# Patient Record
Sex: Female | Born: 1935 | Race: White | Hispanic: No | Marital: Married | Smoking: Never smoker
Health system: Southern US, Community
[De-identification: ages and names within clinical notes are randomized; demographics above are authoritative.]

## PROBLEM LIST (undated history)

## (undated) DIAGNOSIS — Z9889 Other specified postprocedural states: Secondary | ICD-10-CM

## (undated) DIAGNOSIS — E119 Type 2 diabetes mellitus without complications: Secondary | ICD-10-CM

## (undated) DIAGNOSIS — K219 Gastro-esophageal reflux disease without esophagitis: Secondary | ICD-10-CM

## (undated) DIAGNOSIS — Z8719 Personal history of other diseases of the digestive system: Secondary | ICD-10-CM

## (undated) DIAGNOSIS — I10 Essential (primary) hypertension: Secondary | ICD-10-CM

## (undated) DIAGNOSIS — N390 Urinary tract infection, site not specified: Secondary | ICD-10-CM

## (undated) DIAGNOSIS — I447 Left bundle-branch block, unspecified: Secondary | ICD-10-CM

## (undated) DIAGNOSIS — R112 Nausea with vomiting, unspecified: Secondary | ICD-10-CM

## (undated) DIAGNOSIS — E039 Hypothyroidism, unspecified: Secondary | ICD-10-CM

## (undated) HISTORY — PX: ABDOMINAL HYSTERECTOMY: SHX81

## (undated) HISTORY — PX: CHOLECYSTECTOMY: SHX55

---

## 1998-02-07 HISTORY — PX: BACK SURGERY: SHX140

## 2001-02-07 HISTORY — PX: JOINT REPLACEMENT: SHX530

## 2001-06-14 ENCOUNTER — Encounter: Payer: Self-pay | Admitting: *Deleted

## 2001-06-14 ENCOUNTER — Ambulatory Visit (HOSPITAL_COMMUNITY): Admission: RE | Admit: 2001-06-14 | Discharge: 2001-06-14 | Payer: Self-pay | Admitting: *Deleted

## 2003-02-21 ENCOUNTER — Ambulatory Visit (HOSPITAL_COMMUNITY): Admission: RE | Admit: 2003-02-21 | Discharge: 2003-02-21 | Payer: Self-pay | Admitting: *Deleted

## 2003-03-20 ENCOUNTER — Encounter: Payer: Self-pay | Admitting: Neurosurgery

## 2003-03-20 ENCOUNTER — Ambulatory Visit (HOSPITAL_COMMUNITY): Admission: RE | Admit: 2003-03-20 | Discharge: 2003-03-21 | Payer: Self-pay | Admitting: Neurosurgery

## 2003-09-15 ENCOUNTER — Encounter: Admission: RE | Admit: 2003-09-15 | Discharge: 2003-09-15 | Payer: Self-pay | Admitting: Neurosurgery

## 2003-10-06 ENCOUNTER — Encounter: Admission: RE | Admit: 2003-10-06 | Discharge: 2003-10-06 | Payer: Self-pay | Admitting: Neurosurgery

## 2004-02-26 ENCOUNTER — Encounter: Admission: RE | Admit: 2004-02-26 | Discharge: 2004-02-26 | Payer: Self-pay | Admitting: Neurosurgery

## 2010-02-07 HISTORY — PX: EYE SURGERY: SHX253

## 2013-07-26 ENCOUNTER — Other Ambulatory Visit: Payer: Self-pay | Admitting: Neurosurgery

## 2013-07-26 DIAGNOSIS — M549 Dorsalgia, unspecified: Secondary | ICD-10-CM

## 2013-07-29 ENCOUNTER — Ambulatory Visit
Admission: RE | Admit: 2013-07-29 | Discharge: 2013-07-29 | Disposition: A | Discharge status: Home / Self Care | Payer: Medicare Other | Source: Ambulatory Visit | Attending: Neurosurgery | Admitting: Neurosurgery

## 2013-07-29 VITALS — BP 147/73 | HR 90

## 2013-07-29 DIAGNOSIS — M549 Dorsalgia, unspecified: Secondary | ICD-10-CM

## 2013-07-29 MED ORDER — IOHEXOL 180 MG/ML  SOLN
1.0000 mL | Freq: Once | INTRAMUSCULAR | Status: AC | PRN
  Administered 2013-07-29: 1 mL via EPIDURAL

## 2013-07-29 MED ORDER — METHYLPREDNISOLONE ACETATE 40 MG/ML INJ SUSP (RADIOLOG
120.0000 mg | Freq: Once | INTRAMUSCULAR | Status: AC
  Administered 2013-07-29: 120 mg via EPIDURAL

## 2013-07-29 NOTE — Discharge Instructions (Signed)

## 2013-08-14 ENCOUNTER — Other Ambulatory Visit: Payer: Self-pay | Admitting: Neurosurgery

## 2013-08-15 ENCOUNTER — Encounter (HOSPITAL_COMMUNITY): Payer: Self-pay | Admitting: Pharmacy Technician

## 2013-08-21 ENCOUNTER — Encounter (HOSPITAL_COMMUNITY): Payer: Self-pay

## 2013-08-21 ENCOUNTER — Encounter (HOSPITAL_COMMUNITY)
Admission: RE | Admit: 2013-08-21 | Discharge: 2013-08-21 | Disposition: A | Discharge status: Home / Self Care | Payer: Medicare Other | Source: Ambulatory Visit | Attending: Neurosurgery | Admitting: Neurosurgery

## 2013-08-21 ENCOUNTER — Encounter (HOSPITAL_COMMUNITY)
Admission: RE | Admit: 2013-08-21 | Discharge: 2013-08-21 | Disposition: A | Discharge status: Home / Self Care | Payer: Medicare Other | Source: Ambulatory Visit | Attending: Anesthesiology | Admitting: Anesthesiology

## 2013-08-21 HISTORY — DX: Nausea with vomiting, unspecified: R11.2

## 2013-08-21 HISTORY — DX: Personal history of other diseases of the digestive system: Z87.19

## 2013-08-21 HISTORY — DX: Urinary tract infection, site not specified: N39.0

## 2013-08-21 HISTORY — DX: Left bundle-branch block, unspecified: I44.7

## 2013-08-21 HISTORY — DX: Type 2 diabetes mellitus without complications: E11.9

## 2013-08-21 HISTORY — DX: Other specified postprocedural states: Z98.890

## 2013-08-21 HISTORY — DX: Gastro-esophageal reflux disease without esophagitis: K21.9

## 2013-08-21 HISTORY — DX: Hypothyroidism, unspecified: E03.9

## 2013-08-21 HISTORY — DX: Essential (primary) hypertension: I10

## 2013-08-21 LAB — BASIC METABOLIC PANEL
Anion gap: 14 (ref 5–15)
BUN: 16 mg/dL (ref 6–23)
CALCIUM: 9.6 mg/dL (ref 8.4–10.5)
CHLORIDE: 100 meq/L (ref 96–112)
CO2: 27 mEq/L (ref 19–32)
Creatinine, Ser: 0.6 mg/dL (ref 0.50–1.10)
GFR calc Af Amer: >90 mL/min (ref >90)
GFR calc non Af Amer: 86 mL/min — ABNORMAL LOW (ref >90)
GLUCOSE: 146 mg/dL — AB (ref 70–99)
Potassium: 4.2 mEq/L (ref 3.7–5.3)
Sodium: 141 mEq/L (ref 137–147)

## 2013-08-21 LAB — SURGICAL PCR SCREEN
MRSA, PCR: NEGATIVE
Staphylococcus aureus: NEGATIVE

## 2013-08-21 LAB — CBC
HEMATOCRIT: 38.9 % (ref 36.0–46.0)
HEMOGLOBIN: 13.2 g/dL (ref 12.0–15.0)
MCH: 31.1 pg (ref 26.0–34.0)
MCHC: 33.9 g/dL (ref 30.0–36.0)
MCV: 91.5 fL (ref 78.0–100.0)
Platelets: 191 10*3/uL (ref 150–400)
RBC: 4.25 MIL/uL (ref 3.87–5.11)
RDW: 13 % (ref 11.5–15.5)
WBC: 8.7 10*3/uL (ref 4.0–10.5)

## 2013-08-21 MED ORDER — DEXAMETHASONE SODIUM PHOSPHATE 4 MG/ML IJ SOLN
10.0000 mg | INTRAMUSCULAR | Status: DC
  Filled 2013-08-21: qty 3
  Filled 2013-08-21 (×2): qty 1

## 2013-08-21 MED ORDER — CEFAZOLIN SODIUM-DEXTROSE 2-3 GM-% IV SOLR
2.0000 g | INTRAVENOUS | Status: AC
  Administered 2013-08-22: 2 g via INTRAVENOUS
  Filled 2013-08-21 (×2): qty 50

## 2013-08-21 NOTE — Progress Notes (Signed)
Anesthesia Chart Review:  Patient is a 79 year old female scheduled for left L3-4 microdiskectomy tomorrow.  Her PAT appointment was today, and chart was given to me to review at 4 PM.  History includes non-smoker, HTN, DM2, GERD, hiatal hernia, left BBB, hypothyroidism, right joint replacement, cholecystectomy, L4-5 microdissection 03/20/03, post-operative N/V and sore throat after anesthesia. PCP is with [facility] in [place] (notes under Care Everywhere). No cardiac history or recent cardiac studies reported.   EKG on 08/21/13 showed SR, left BBB. She had a left BBB on previous preoperative EKG on 03/20/03 (see Muse; also printed and placed on chart).    CXR on 08/21/13 showed No acute cardiopulmonary abnormality seen.  Preoperative labs noted.  A1C on 06/05/13 was 7.0.    Left BBB has been present for at least 10 years, so clinical correlation on the day of surgery.  If no worrisome CV symptoms (none reported to her PAT RN today), then I would anticipate that she could proceed as planned. Further evaluation by her assigned anesthesiologist on the day of surgery.    Velna Ochsllison Rashae Rother, PA-C [facility]/Anesthesiology Phone [phone] 08/21/2013 4:10 PM

## 2013-08-21 NOTE — Progress Notes (Signed)
req'd notes, ekg from [facility] [place]

## 2013-08-21 NOTE — Pre-Procedure Instructions (Addendum)
Susan Ibarra  08/21/2013   Your procedure is scheduled on:  08/22/13  [facility] to [facility] short stay admitting at 800 AM.  Call this number if you have problems the morning of surgery: [phone]   Remember:   Do not eat food or drink liquids after midnight.   Take these medicines the morning of surgery with A SIP OF WATER: levothyroxine, metoprolol(toprol),zantac       STOP all herbel meds, nsaids (aleve,naproxen,advil,ibuprofen) now including,vitamins, aspirin   NO DIABETIC MED AM OF SURGERY    Do not wear jewelry, make-up or nail polish.  Do not wear lotions, powders, or perfumes. You may wear deodorant.  Do not shave 48 hours prior to surgery. Men may shave face and neck.  Do not bring valuables to the hospital.  [facility] is not responsible                  for any belongings or valuables.               Contacts, dentures or bridgework may not be worn into surgery.  Leave suitcase in the car. After surgery it may be brought to your room.  For patients admitted to the hospital, discharge time is determined by your                treatment team.               Patients discharged the day of surgery will not be allowed to drive  home.  Name and phone number of your driver:   Special Instructions:  Special Instructions: [facility] - Preparing for Surgery  Before surgery, you can play an important role.  Because skin is not sterile, your skin needs to be as free of germs as possible.  You can reduce the number of germs on you skin by washing with CHG (chlorahexidine gluconate) soap before surgery.  CHG is an antiseptic cleaner which kills germs and bonds with the skin to continue killing germs even after washing.  Please DO NOT use if you have an allergy to CHG or antibacterial soaps.  If your skin becomes reddened/irritated stop using the CHG and inform your nurse when you arrive at Short Stay.  Do not shave (including legs and underarms) for at least 48 hours prior to the  first CHG shower.  You may shave your face.  Please follow these instructions carefully:   1.  Shower with CHG Soap the night before surgery and the morning of Surgery.  2.  If you choose to wash your hair, wash your hair first as usual with your normal shampoo.  3.  After you shampoo, rinse your hair and body thoroughly to remove the Shampoo.  4.  Use CHG as you would any other liquid soap.  You can apply chg directly  to the skin and wash gently with scrungie or a clean washcloth.  5.  Apply the CHG Soap to your body ONLY FROM THE NECK DOWN.  Do not use on open wounds or open sores.  Avoid contact with your eyes ears, mouth and genitals (private parts).  Wash genitals (private parts)       with your normal soap.  6.  Wash thoroughly, paying special attention to the area where your surgery will be performed.  7.  Thoroughly rinse your body with warm water from the neck down.  8.  DO NOT shower/wash with your normal soap after using and rinsing off the  CHG Soap.  9.  Pat yourself dry with a clean towel.            10.  Wear clean pajamas.            11.  Place clean sheets on your bed the night of your first shower and do not sleep with pets.  Day of Surgery  Do not apply any lotions/deodorants the morning of surgery.  Please wear clean clothes to the hospital/surgery center.   Please read over the following fact sheets that you were given: Pain Booklet, Coughing and Deep Breathing, MRSA Information and Surgical Site Infection Prevention

## 2013-08-22 ENCOUNTER — Encounter (HOSPITAL_COMMUNITY): Payer: Self-pay | Admitting: Surgery

## 2013-08-22 ENCOUNTER — Ambulatory Visit (HOSPITAL_COMMUNITY)
Admission: RE | Admit: 2013-08-22 | Discharge: 2013-08-23 | Disposition: A | Discharge status: Home / Self Care | Payer: Medicare Other | Source: Ambulatory Visit | Attending: Neurosurgery | Admitting: Neurosurgery

## 2013-08-22 ENCOUNTER — Ambulatory Visit (HOSPITAL_COMMUNITY): Payer: Medicare Other | Admitting: Anesthesiology

## 2013-08-22 ENCOUNTER — Encounter (HOSPITAL_COMMUNITY): Payer: Medicare Other | Admitting: Vascular Surgery

## 2013-08-22 ENCOUNTER — Ambulatory Visit (HOSPITAL_COMMUNITY): Payer: Medicare Other

## 2013-08-22 ENCOUNTER — Encounter (HOSPITAL_COMMUNITY)
Admission: RE | Disposition: A | Discharge status: Home / Self Care | Payer: Medicare Other | Source: Ambulatory Visit | Attending: Neurosurgery

## 2013-08-22 DIAGNOSIS — I447 Left bundle-branch block, unspecified: Secondary | ICD-10-CM | POA: Insufficient documentation

## 2013-08-22 DIAGNOSIS — K219 Gastro-esophageal reflux disease without esophagitis: Secondary | ICD-10-CM | POA: Insufficient documentation

## 2013-08-22 DIAGNOSIS — Z7982 Long term (current) use of aspirin: Secondary | ICD-10-CM | POA: Insufficient documentation

## 2013-08-22 DIAGNOSIS — M5126 Other intervertebral disc displacement, lumbar region: Secondary | ICD-10-CM | POA: Insufficient documentation

## 2013-08-22 DIAGNOSIS — K449 Diaphragmatic hernia without obstruction or gangrene: Secondary | ICD-10-CM | POA: Insufficient documentation

## 2013-08-22 DIAGNOSIS — Z9660 Presence of unspecified orthopedic joint implant: Secondary | ICD-10-CM | POA: Insufficient documentation

## 2013-08-22 DIAGNOSIS — E119 Type 2 diabetes mellitus without complications: Secondary | ICD-10-CM | POA: Insufficient documentation

## 2013-08-22 DIAGNOSIS — I10 Essential (primary) hypertension: Secondary | ICD-10-CM | POA: Insufficient documentation

## 2013-08-22 DIAGNOSIS — E039 Hypothyroidism, unspecified: Secondary | ICD-10-CM | POA: Insufficient documentation

## 2013-08-22 HISTORY — PX: LUMBAR LAMINECTOMY/DECOMPRESSION MICRODISCECTOMY: SHX5026

## 2013-08-22 LAB — GLUCOSE, CAPILLARY
Glucose-Capillary: 147 mg/dL — ABNORMAL HIGH (ref 70–99)
Glucose-Capillary: 98 mg/dL (ref 70–99)
Glucose-Capillary: 221 mg/dL — ABNORMAL HIGH (ref 70–99)
Glucose-Capillary: 287 mg/dL — ABNORMAL HIGH (ref 70–99)

## 2013-08-22 SURGERY — LUMBAR LAMINECTOMY/DECOMPRESSION MICRODISCECTOMY 1 LEVEL
Anesthesia: General | Site: Back | Laterality: Left

## 2013-08-22 MED ORDER — HYDROMORPHONE HCL PF 1 MG/ML IJ SOLN
INTRAMUSCULAR | Status: AC
  Filled 2013-08-22: qty 1

## 2013-08-22 MED ORDER — ROCURONIUM BROMIDE 100 MG/10ML IV SOLN
INTRAVENOUS | Status: DC | PRN
  Administered 2013-08-22: 50 mg via INTRAVENOUS

## 2013-08-22 MED ORDER — INSULIN ASPART 100 UNIT/ML ~~LOC~~ SOLN
0.0000 [IU] | Freq: Three times a day (TID) | SUBCUTANEOUS | Status: DC
  Administered 2013-08-22: 3 [IU] via SUBCUTANEOUS

## 2013-08-22 MED ORDER — ROCURONIUM BROMIDE 50 MG/5ML IV SOLN
INTRAVENOUS | Status: AC
  Filled 2013-08-22: qty 1

## 2013-08-22 MED ORDER — PROPOFOL 10 MG/ML IV BOLUS
INTRAVENOUS | Status: DC | PRN
  Administered 2013-08-22: 130 mg via INTRAVENOUS

## 2013-08-22 MED ORDER — ASPIRIN EC 81 MG PO TBEC
81.0000 mg | DELAYED_RELEASE_TABLET | Freq: Every day | ORAL | Status: DC
  Administered 2013-08-22: 81 mg via ORAL
  Filled 2013-08-22 (×2): qty 1

## 2013-08-22 MED ORDER — METFORMIN HCL 500 MG PO TABS
1000.0000 mg | ORAL_TABLET | Freq: Two times a day (BID) | ORAL | Status: DC
  Administered 2013-08-22: 1000 mg via ORAL
  Filled 2013-08-22 (×4): qty 2

## 2013-08-22 MED ORDER — SODIUM CHLORIDE 0.9 % IJ SOLN
INTRAMUSCULAR | Status: AC
  Filled 2013-08-22: qty 10

## 2013-08-22 MED ORDER — FENTANYL CITRATE 0.05 MG/ML IJ SOLN
INTRAMUSCULAR | Status: DC | PRN
  Administered 2013-08-22: 150 ug via INTRAVENOUS
  Administered 2013-08-22 (×2): 50 ug via INTRAVENOUS

## 2013-08-22 MED ORDER — LIDOCAINE HCL (CARDIAC) 20 MG/ML IV SOLN
INTRAVENOUS | Status: AC
  Filled 2013-08-22: qty 5

## 2013-08-22 MED ORDER — CEFAZOLIN SODIUM 1-5 GM-% IV SOLN
1.0000 g | Freq: Three times a day (TID) | INTRAVENOUS | Status: AC
  Administered 2013-08-22 – 2013-08-23 (×2): 1 g via INTRAVENOUS
  Filled 2013-08-22 (×2): qty 50

## 2013-08-22 MED ORDER — LACTATED RINGERS IV SOLN
INTRAVENOUS | Status: DC
  Administered 2013-08-22 (×3): via INTRAVENOUS

## 2013-08-22 MED ORDER — ACETAMINOPHEN 650 MG RE SUPP: 650.0000 mg | RECTAL | Status: DC | PRN

## 2013-08-22 MED ORDER — OXYCODONE HCL 5 MG PO TABS: 5.0000 mg | ORAL_TABLET | Freq: Once | ORAL | Status: DC | PRN

## 2013-08-22 MED ORDER — PHENYLEPHRINE HCL 10 MG/ML IJ SOLN
INTRAMUSCULAR | Status: DC | PRN
  Administered 2013-08-22 (×5): 80 ug via INTRAVENOUS

## 2013-08-22 MED ORDER — INSULIN ASPART 100 UNIT/ML ~~LOC~~ SOLN
3.0000 [IU] | Freq: Three times a day (TID) | SUBCUTANEOUS | Status: DC
  Administered 2013-08-22: 3 [IU] via SUBCUTANEOUS

## 2013-08-22 MED ORDER — SUCCINYLCHOLINE CHLORIDE 20 MG/ML IJ SOLN
INTRAMUSCULAR | Status: AC
  Filled 2013-08-22: qty 1

## 2013-08-22 MED ORDER — PANTOPRAZOLE SODIUM 40 MG IV SOLR
40.0000 mg | Freq: Every day | INTRAVENOUS | Status: DC
  Administered 2013-08-22: 40 mg via INTRAVENOUS
  Filled 2013-08-22 (×2): qty 40

## 2013-08-22 MED ORDER — PHENOL 1.4 % MT LIQD: 1.0000 | OROMUCOSAL | Status: DC | PRN

## 2013-08-22 MED ORDER — 0.9 % SODIUM CHLORIDE (POUR BTL) OPTIME
TOPICAL | Status: DC | PRN
Start: 2013-08-22 — End: 2013-08-22
  Administered 2013-08-22: 1000 mL

## 2013-08-22 MED ORDER — ARTIFICIAL TEARS OP OINT
TOPICAL_OINTMENT | OPHTHALMIC | Status: DC | PRN
  Administered 2013-08-22: 1 via OPHTHALMIC

## 2013-08-22 MED ORDER — HYDROMORPHONE HCL PF 1 MG/ML IJ SOLN
0.2500 mg | INTRAMUSCULAR | Status: DC | PRN
  Administered 2013-08-22: 0.5 mg via INTRAVENOUS

## 2013-08-22 MED ORDER — NEOSTIGMINE METHYLSULFATE 10 MG/10ML IV SOLN
INTRAVENOUS | Status: AC
  Filled 2013-08-22: qty 1

## 2013-08-22 MED ORDER — MIDAZOLAM HCL 2 MG/2ML IJ SOLN
INTRAMUSCULAR | Status: DC | PRN
  Administered 2013-08-22: 2 mg via INTRAVENOUS

## 2013-08-22 MED ORDER — ACETAMINOPHEN 160 MG/5ML PO SOLN
325.0000 mg | ORAL | Status: DC | PRN
  Filled 2013-08-22: qty 20.3

## 2013-08-22 MED ORDER — EPHEDRINE SULFATE 50 MG/ML IJ SOLN
INTRAMUSCULAR | Status: AC
  Filled 2013-08-22: qty 1

## 2013-08-22 MED ORDER — GLYCOPYRROLATE 0.2 MG/ML IJ SOLN
INTRAMUSCULAR | Status: AC
  Filled 2013-08-22: qty 2

## 2013-08-22 MED ORDER — KETOROLAC TROMETHAMINE 30 MG/ML IJ SOLN
30.0000 mg | Freq: Four times a day (QID) | INTRAMUSCULAR | Status: DC
  Administered 2013-08-22 – 2013-08-23 (×3): 30 mg via INTRAVENOUS
  Filled 2013-08-22 (×7): qty 1

## 2013-08-22 MED ORDER — ACETAMINOPHEN 325 MG PO TABS: 325.0000 mg | ORAL_TABLET | ORAL | Status: DC | PRN

## 2013-08-22 MED ORDER — METOPROLOL SUCCINATE ER 50 MG PO TB24
50.0000 mg | ORAL_TABLET | Freq: Two times a day (BID) | ORAL | Status: DC
  Administered 2013-08-22: 50 mg via ORAL
  Filled 2013-08-22 (×3): qty 1

## 2013-08-22 MED ORDER — MENTHOL 3 MG MT LOZG: 1.0000 | LOZENGE | OROMUCOSAL | Status: DC | PRN

## 2013-08-22 MED ORDER — OXYCODONE HCL 5 MG/5ML PO SOLN: 5.0000 mg | Freq: Once | ORAL | Status: DC | PRN

## 2013-08-22 MED ORDER — HYDROMORPHONE HCL PF 1 MG/ML IJ SOLN: 1.0000 mg | INTRAMUSCULAR | Status: DC | PRN

## 2013-08-22 MED ORDER — ARTIFICIAL TEARS OP OINT
TOPICAL_OINTMENT | OPHTHALMIC | Status: AC
  Filled 2013-08-22: qty 3.5

## 2013-08-22 MED ORDER — MIDAZOLAM HCL 2 MG/2ML IJ SOLN
INTRAMUSCULAR | Status: AC
  Filled 2013-08-22: qty 2

## 2013-08-22 MED ORDER — PROPOFOL 10 MG/ML IV BOLUS
INTRAVENOUS | Status: AC
  Filled 2013-08-22: qty 20

## 2013-08-22 MED ORDER — PROMETHAZINE HCL 25 MG/ML IJ SOLN
INTRAMUSCULAR | Status: AC
  Administered 2013-08-22: 12.5 mg
  Filled 2013-08-22: qty 1

## 2013-08-22 MED ORDER — SODIUM CHLORIDE 0.9 % IJ SOLN
3.0000 mL | Freq: Two times a day (BID) | INTRAMUSCULAR | Status: DC
  Administered 2013-08-22: 3 mL via INTRAVENOUS

## 2013-08-22 MED ORDER — DEXAMETHASONE SODIUM PHOSPHATE 4 MG/ML IJ SOLN
INTRAMUSCULAR | Status: DC | PRN
  Administered 2013-08-22: 10 mg via INTRAVENOUS

## 2013-08-22 MED ORDER — GLIMEPIRIDE 1 MG PO TABS: 1.0000 mg | ORAL_TABLET | Freq: Every day | ORAL | Status: DC | PRN

## 2013-08-22 MED ORDER — THROMBIN 5000 UNITS EX SOLR
CUTANEOUS | Status: DC | PRN
  Administered 2013-08-22 (×2): 5000 [IU] via TOPICAL

## 2013-08-22 MED ORDER — LIDOCAINE HCL (CARDIAC) 20 MG/ML IV SOLN
INTRAVENOUS | Status: DC | PRN
  Administered 2013-08-22: 60 mg via INTRAVENOUS

## 2013-08-22 MED ORDER — FENTANYL CITRATE 0.05 MG/ML IJ SOLN
INTRAMUSCULAR | Status: AC
  Filled 2013-08-22: qty 5

## 2013-08-22 MED ORDER — NEOSTIGMINE METHYLSULFATE 10 MG/10ML IV SOLN
INTRAVENOUS | Status: DC | PRN
  Administered 2013-08-22: 4 mg via INTRAVENOUS

## 2013-08-22 MED ORDER — ACETAMINOPHEN 325 MG PO TABS: 650.0000 mg | ORAL_TABLET | ORAL | Status: DC | PRN

## 2013-08-22 MED ORDER — HYDROCODONE-ACETAMINOPHEN 5-325 MG PO TABS
1.0000 | ORAL_TABLET | ORAL | Status: DC | PRN
  Administered 2013-08-22: 1 via ORAL
  Filled 2013-08-22: qty 1

## 2013-08-22 MED ORDER — ONDANSETRON HCL 4 MG/2ML IJ SOLN
4.0000 mg | Freq: Once | INTRAMUSCULAR | Status: AC | PRN
  Administered 2013-08-22: 4 mg via INTRAVENOUS

## 2013-08-22 MED ORDER — GLYCOPYRROLATE 0.2 MG/ML IJ SOLN
INTRAMUSCULAR | Status: DC | PRN
  Administered 2013-08-22: .7 mg via INTRAVENOUS

## 2013-08-22 MED ORDER — ONDANSETRON HCL 4 MG/2ML IJ SOLN: 4.0000 mg | INTRAMUSCULAR | Status: DC | PRN

## 2013-08-22 MED ORDER — ONDANSETRON HCL 4 MG/2ML IJ SOLN
INTRAMUSCULAR | Status: AC
Start: 2013-08-22 — End: 2013-08-23
  Filled 2013-08-22: qty 2

## 2013-08-22 MED ORDER — BUPIVACAINE HCL (PF) 0.5 % IJ SOLN
INTRAMUSCULAR | Status: DC | PRN
  Administered 2013-08-22: 20 mL

## 2013-08-22 MED ORDER — POTASSIUM CHLORIDE IN NACL 20-0.45 MEQ/L-% IV SOLN
INTRAVENOUS | Status: DC
  Filled 2013-08-22 (×3): qty 1000

## 2013-08-22 MED ORDER — INSULIN ASPART 100 UNIT/ML ~~LOC~~ SOLN
0.0000 [IU] | Freq: Every day | SUBCUTANEOUS | Status: DC
  Administered 2013-08-22: 3 [IU] via SUBCUTANEOUS

## 2013-08-22 MED ORDER — ONDANSETRON HCL 4 MG/2ML IJ SOLN
INTRAMUSCULAR | Status: DC | PRN
  Administered 2013-08-22: 4 mg via INTRAVENOUS

## 2013-08-22 MED ORDER — LEVOTHYROXINE SODIUM 50 MCG PO TABS
50.0000 ug | ORAL_TABLET | Freq: Every day | ORAL | Status: DC
  Administered 2013-08-23: 50 ug via ORAL
  Filled 2013-08-22 (×2): qty 1

## 2013-08-22 MED ORDER — HEMOSTATIC AGENTS (NO CHARGE) OPTIME
TOPICAL | Status: DC | PRN
  Administered 2013-08-22: 1 via TOPICAL

## 2013-08-22 MED ORDER — ONDANSETRON HCL 4 MG/2ML IJ SOLN
INTRAMUSCULAR | Status: AC
  Filled 2013-08-22: qty 2

## 2013-08-22 MED ORDER — KETOROLAC TROMETHAMINE 15 MG/ML IJ SOLN
INTRAMUSCULAR | Status: DC | PRN
  Administered 2013-08-22: 15 mg via INTRAVENOUS

## 2013-08-22 MED ORDER — SODIUM CHLORIDE 0.9 % IJ SOLN: 3.0000 mL | INTRAMUSCULAR | Status: DC | PRN

## 2013-08-22 SURGICAL SUPPLY — 55 items
ADH SKN CLS APL DERMABOND .7 (GAUZE/BANDAGES/DRESSINGS) ×1
APL SKNCLS STERI-STRIP NONHPOA (GAUZE/BANDAGES/DRESSINGS) ×1
BAG DECANTER FOR FLEXI CONT (MISCELLANEOUS) ×3 IMPLANT
BENZOIN TINCTURE PRP APPL 2/3 (GAUZE/BANDAGES/DRESSINGS) ×4 IMPLANT
BLADE 10 SAFETY STRL DISP (BLADE) ×3 IMPLANT
BLADE SURG ROTATE 9660 (MISCELLANEOUS) ×3 IMPLANT
BRUSH SCRUB EZ PLAIN DRY (MISCELLANEOUS) ×3 IMPLANT
BUR CUTTER 7.0 ROUND (BURR) ×3 IMPLANT
BUR MATCHSTICK NEURO 3.0 LAGG (BURR) ×3 IMPLANT
CANISTER SUCT 3000ML (MISCELLANEOUS) ×3 IMPLANT
CLOSURE WOUND 1/2 X4 (GAUZE/BANDAGES/DRESSINGS) ×1
CONT SPEC 4OZ CLIKSEAL STRL BL (MISCELLANEOUS) ×3 IMPLANT
DERMABOND ADVANCED (GAUZE/BANDAGES/DRESSINGS) ×2
DERMABOND ADVANCED .7 DNX12 (GAUZE/BANDAGES/DRESSINGS) ×1 IMPLANT
DRAPE LAPAROTOMY 100X72X124 (DRAPES) ×3 IMPLANT
DRAPE MICROSCOPE LEICA (MISCELLANEOUS) ×2 IMPLANT
DRAPE MICROSCOPE ZEISS OPMI (DRAPES) ×1 IMPLANT
DRAPE SURG 17X23 STRL (DRAPES) ×6 IMPLANT
DRSG OPSITE POSTOP 4X6 (GAUZE/BANDAGES/DRESSINGS) ×2 IMPLANT
DRSG TELFA 3X8 NADH (GAUZE/BANDAGES/DRESSINGS) ×3 IMPLANT
DURAPREP 26ML APPLICATOR (WOUND CARE) ×3 IMPLANT
ELECT REM PT RETURN 9FT ADLT (ELECTROSURGICAL) ×3
ELECTRODE REM PT RTRN 9FT ADLT (ELECTROSURGICAL) ×1 IMPLANT
GAUZE SPONGE 4X4 16PLY XRAY LF (GAUZE/BANDAGES/DRESSINGS) IMPLANT
GLOVE BIOGEL PI IND STRL 7.0 (GLOVE) IMPLANT
GLOVE BIOGEL PI INDICATOR 7.0 (GLOVE) ×2
GLOVE ECLIPSE 8.0 STRL XLNG CF (GLOVE) ×1 IMPLANT
GLOVE SURG SS PI 6.5 STRL IVOR (GLOVE) ×2 IMPLANT
GLOVE SURG SS PI 7.0 STRL IVOR (GLOVE) ×4 IMPLANT
GLOVE SURG SS PI 8.0 STRL IVOR (GLOVE) ×2 IMPLANT
GOWN STRL REUS W/ TWL LRG LVL3 (GOWN DISPOSABLE) IMPLANT
GOWN STRL REUS W/ TWL XL LVL3 (GOWN DISPOSABLE) ×1 IMPLANT
GOWN STRL REUS W/TWL 2XL LVL3 (GOWN DISPOSABLE) IMPLANT
GOWN STRL REUS W/TWL LRG LVL3 (GOWN DISPOSABLE) ×3
GOWN STRL REUS W/TWL XL LVL3 (GOWN DISPOSABLE) ×6
KIT BASIN OR (CUSTOM PROCEDURE TRAY) ×3 IMPLANT
KIT ROOM TURNOVER OR (KITS) ×3 IMPLANT
NDL SPNL 22GX3.5 QUINCKE BK (NEEDLE) ×2 IMPLANT
NEEDLE HYPO 22GX1.5 SAFETY (NEEDLE) ×3 IMPLANT
NEEDLE SPNL 22GX3.5 QUINCKE BK (NEEDLE) ×6 IMPLANT
NS IRRIG 1000ML POUR BTL (IV SOLUTION) ×3 IMPLANT
PACK LAMINECTOMY NEURO (CUSTOM PROCEDURE TRAY) ×3 IMPLANT
PAD ARMBOARD 7.5X6 YLW CONV (MISCELLANEOUS) ×9 IMPLANT
PAD DRESSING TELFA 3X8 NADH (GAUZE/BANDAGES/DRESSINGS) ×1 IMPLANT
PATTIES SURGICAL .75X.75 (GAUZE/BANDAGES/DRESSINGS) ×1 IMPLANT
RUBBERBAND STERILE (MISCELLANEOUS) ×6 IMPLANT
SPONGE GAUZE 4X4 12PLY (GAUZE/BANDAGES/DRESSINGS) ×3 IMPLANT
SPONGE SURGIFOAM ABS GEL SZ50 (HEMOSTASIS) ×3 IMPLANT
STRIP CLOSURE SKIN 1/2X4 (GAUZE/BANDAGES/DRESSINGS) ×2 IMPLANT
SUT VIC AB 2-0 OS6 18 (SUTURE) ×9 IMPLANT
SUT VIC AB 3-0 CP2 18 (SUTURE) ×3 IMPLANT
SYR 20ML ECCENTRIC (SYRINGE) ×3 IMPLANT
TOWEL OR 17X24 6PK STRL BLUE (TOWEL DISPOSABLE) ×3 IMPLANT
TOWEL OR 17X26 10 PK STRL BLUE (TOWEL DISPOSABLE) ×3 IMPLANT
WATER STERILE IRR 1000ML POUR (IV SOLUTION) ×3 IMPLANT

## 2013-08-22 NOTE — Op Note (Signed)
Preop diagnosis: Herniated disc L3-4 left with superior fragment Postop diagnosis: Same Procedure: Left L3-4 intralaminar laminotomy for excision of herniated disc with operating microscope Surgeon: Kadijah Shamoon Assistant: Cabbell  After being placed the prone position the patient's back was prepped and draped in the usual sterile fashion. Localizing x-rays taken prior to incision to identify the appropriate level. Midline incision was made above the spinous processes of L3 and L4. Using Bovie cutting current the incision was carried on the spinous processes. Subperiosteal dissection was then carried out on the left side the spinous processes and lamina self-retaining tract was placed for exposure. X-ray showed approach the appropriate level. Using a high-speed drill the inferior one half of the L3 lamina the medial one third of the facet joint the superior one third of the L4 lamina were removed. Residual bone and ligamentum flavum removed in a piecemeal fashion. The microscope was draped brought into the field and used for the remainder of the case. Using microdissection technique the lateral aspect of the thecal sac and L4 nerve were identified. Further coagulation was carried out down before the canal to identify the L3-4 disc of some and markedly herniated with extension above the disc space. We then incised the annulus and thoroughly cleaned out the disc space with pituitary rongeurs and curettes. Inspection superior to the disc space yielded large numbers of subligamentous fragments. These were removed until the thecal sac was well decompressed. At this time inspection was carried out in all directions for any evidence of residual compression and none could be identified. Irrigation was carried out and any bleeding control proper coagulation Gelfoam. The was then closed in multiple layers of Vicryl on the muscle fascia subcutaneous and subcuticular tissues. Dermabond and Steri-Strips were placed on the skin.  Shortness was then applied the patient was extubated and taken to recovery room in stable condition.

## 2013-08-22 NOTE — Transfer of Care (Signed)
Immediate Anesthesia Transfer of Care Note  Patient: Susan Ibarra  Procedure(s) Performed: Procedure(s) with comments: LUMBAR LAMINECTOMY/DECOMPRESSION MICRODISCECTOMY Lumbar Three-Four LEFT (Left) - LUMBAR LAMINECTOMY/DECOMPRESSION MICRODISCECTOMY Lumbar Three-Four LEFT  Patient Location: PACU  Anesthesia Type:General  Level of Consciousness: awake and alert   Airway & Oxygen Therapy: Patient Spontanous Breathing and Patient connected to nasal cannula oxygen  Post-op Assessment: Report given to PACU RN and Post -op Vital signs reviewed and stable  Post vital signs: Reviewed and stable  Complications: No apparent anesthesia complications

## 2013-08-22 NOTE — H&P (Signed)
Susan Ibarra is an 79 y.o. female.   Chief Complaint: Lower back pain and left leg HPI: The patient is a 79 year old female who had back surgery in 2500 TIME. She's had low back pain of 3-4 months duration with some pain down the left leg but is not as severe as the back pain. She has been seen by medical Dr. orthopedist her pain medications and injections without relief. An MRI scan was done and she was evaluated in the office. MRI scan showed a disc herniation L3-4 with a fragment on the left side. With a more of the pain in her back and the like however since traditional conservative therapy with Relafen and Flexeril this gave her no relief. We discussed the options and spite of phaco of her pain was in the back to proceed with surgery with removal of the disc fragment at L3-4 on the left side. I had a long discussion with her regarding the risks and benefits of surgical intervention. The risks discussed include but are not limited to bleeding infection weakness of his process fluid leak coma and death. We've discussed alternative methods of therapy offered risks and benefits of nonintervention. She's had the opportunity numerous questions and appears to understand. With this information in hand she has requested we proceed with surgery.  Past Medical History  Diagnosis Date  . Hypertension   . H/O hiatal hernia   . Hypothyroidism   . Diabetes mellitus without complication   . UTI (lower urinary tract infection)   . GERD (gastroesophageal reflux disease)   . PONV (postoperative nausea and vomiting)     gets sore throat,infection in throat after surgery  . Left bundle branch block     at least since 03/20/2003    Past Surgical History  Procedure Laterality Date  . Cholecystectomy  70's  . Back surgery  00  . Joint replacement Right 03  . Eye surgery Bilateral 14  . Abdominal hysterectomy      History reviewed. No pertinent family history. Social History:  reports that she has  never smoked. She has never used smokeless tobacco. She reports that she does not drink alcohol or use illicit drugs.  Allergies:  Allergies  Allergen Reactions  . Aspirin Other (See Comments)    Severe stomach pain   . Codeine Palpitations  . Latex Rash    Rubber gloves for washing dishes. No problems with gloves at dr or hosp  . Sulfa Antibiotics Rash    Medications Prior to Admission  Medication Sig Dispense Refill  . aspirin EC 81 MG tablet Take 81 mg by mouth daily.      Marland Kitchen. glimepiride (AMARYL) 2 MG tablet Take 1 mg by mouth daily as needed (for blood sugar).      Marland Kitchen. levothyroxine (SYNTHROID, LEVOTHROID) 50 MCG tablet Take 50 mcg by mouth daily before breakfast.      . metFORMIN (GLUCOPHAGE) 500 MG tablet Take 1,000 mg by mouth 2 (two) times daily with a meal.      . metoprolol succinate (TOPROL-XL) 50 MG 24 hr tablet Take 50 mg by mouth 2 (two) times daily. Take with or immediately following a meal.      . ranitidine (ZANTAC) 150 MG tablet Take 150 mg by mouth daily as needed for heartburn.      . vitamin B-12 (CYANOCOBALAMIN) 1000 MCG tablet Take 1,000 mcg by mouth daily.        Results for orders placed during the hospital encounter of 08/22/13 (  from the past 48 hour(s))  GLUCOSE, CAPILLARY     Status: Abnormal   Collection Time    08/22/13  8:00 AM      Result Value Ref Range   Glucose-Capillary 147 (*) 70 - 99 mg/dL   Dg Chest 2 View  05/16/8117   CLINICAL DATA:  Hypertension.  EXAM: CHEST  2 VIEW  COMPARISON:  February 09, 2007.  FINDINGS: The heart size and mediastinal contours are within normal limits. Both lungs are clear. No pneumothorax or pleural effusion is noted. The visualized skeletal structures are unremarkable.  IMPRESSION: No acute cardiopulmonary abnormality seen.   Electronically Signed   By: Roque Lias M.D.   On: 08/21/2013 11:43    Positive for back pain and diabetes  Blood pressure 144/65, pulse 86, temperature 97.9 F (36.6 C), temperature source  Oral, resp. rate 18, SpO2 100.00%.  The patient is awake or and oriented. She is no facial asymmetry. She has decreased reflexes in the right lower extremity versus the left decreased dorsiflexion on the right some mild weakness of the knee on the left Assessment/Plan Impression is that of a herniated disc L3-4 on the left. The plan is for a left L3-4 microdiscectomy.  Reinaldo Meeker, MD 08/22/2013, 10:08 AM

## 2013-08-22 NOTE — Anesthesia Preprocedure Evaluation (Signed)
Anesthesia Evaluation  Patient identified by MRN, date of birth, ID band Patient awake    Reviewed: Allergy & Precautions, H&P , NPO status , Patient's Chart, lab work & pertinent test results  History of Anesthesia Complications (+) PONV and history of anesthetic complications  Airway Mallampati: II TM Distance: >3 FB Neck ROM: Full    Dental  (+) Teeth Intact   Pulmonary neg pulmonary ROS,          Cardiovascular hypertension, Pt. on medications and Pt. on home beta blockers - angina- CAD, - Past MI and - PND - dysrhythmias - Cardiac Defibrillator Rhythm:Regular     Neuro/Psych Low back pain  Neuromuscular disease negative psych ROS   GI/Hepatic Neg liver ROS, hiatal hernia, GERD-  ,  Endo/Other  diabetes, Type 2, Oral Hypoglycemic AgentsHypothyroidism   Renal/GU negative Renal ROS     Musculoskeletal  (+) Arthritis -, Osteoarthritis,    Abdominal   Peds  Hematology   Anesthesia Other Findings   Reproductive/Obstetrics                           Anesthesia Physical Anesthesia Plan  ASA: II  Anesthesia Plan: General   Post-op Pain Management:    Induction: Intravenous  Airway Management Planned: Oral ETT  Additional Equipment: None  Intra-op Plan:   Post-operative Plan: Extubation in OR  Informed Consent: I have reviewed the patients History and Physical, chart, labs and discussed the procedure including the risks, benefits and alternatives for the proposed anesthesia with the patient or authorized representative who has indicated his/her understanding and acceptance.   Dental advisory given  Plan Discussed with: CRNA and Surgeon  Anesthesia Plan Comments:         Anesthesia Quick Evaluation

## 2013-08-22 NOTE — Plan of Care (Signed)
Problem: Consults Goal: Diagnosis - Spinal Surgery Outcome: Completed/Met Date Met:  08/22/13 Lumbar Laminectomy (Complex)

## 2013-08-22 NOTE — Anesthesia Postprocedure Evaluation (Signed)
  Anesthesia Post-op Note  Patient: Susan Ibarra  Procedure(s) Performed: Procedure(s) with comments: LUMBAR LAMINECTOMY/DECOMPRESSION MICRODISCECTOMY Lumbar Three-Four LEFT (Left) - LUMBAR LAMINECTOMY/DECOMPRESSION MICRODISCECTOMY Lumbar Three-Four LEFT  Patient Location: PACU  Anesthesia Type:General  Level of Consciousness: awake  Airway and Oxygen Therapy: Patient Spontanous Breathing  Post-op Pain: mild  Post-op Assessment: Post-op Vital signs reviewed, Patient's Cardiovascular Status Stable, Respiratory Function Stable, Patent Airway, NAUSEA AND VOMITING PRESENT and Pain level controlled  Post-op Vital Signs: Reviewed and stable  Last Vitals:  Filed Vitals:   08/22/13 1330  BP: 153/83  Pulse: 72  Temp:   Resp: 13    Complications: No apparent anesthesia complications

## 2013-08-23 ENCOUNTER — Encounter (HOSPITAL_COMMUNITY): Payer: Self-pay | Admitting: Neurosurgery

## 2013-08-23 LAB — GLUCOSE, CAPILLARY: GLUCOSE-CAPILLARY: 123 mg/dL — AB (ref 70–99)

## 2013-08-23 NOTE — Progress Notes (Signed)
Pt doing well. Pt and husband given D/C instructions with verbal understanding of teaching. Pt's IV was removed prior to D/C. Pt D/C'd home via wheelchair @ 0915 per MD order. Pt is stable @ D/C and has no other needs at this time. Rema FendtAshley Sireen Halk, RN

## 2013-08-23 NOTE — Discharge Summary (Signed)
  Physician Discharge Summary  Patient ID: Susan Ibarra MRN: [id number] DOB/AGE: 02-09-1934 79 y.o.  Admit date: 08/22/2013 Discharge date: 08/23/2013  Admission Diagnoses:  Discharge Diagnoses:  Active Problems:   Herniated lumbar intervertebral disc   Discharged Condition: good  Hospital Course: Surgery one day for herniated lumbar disc. Did well. No pain on pod 1. Ambulated well. Wound fine. Home with specific instructions.  Consults: None  Significant Diagnostic Studies: none  Treatments: surgery: Left L 34 microdiscectomy  Discharge Exam: Blood pressure 108/65, pulse 94, temperature 97.5 F (36.4 C), temperature source Oral, resp. rate 18, SpO2 96.00%. Incision/Wound:clean and ry; no new neuro issues  Disposition:      Medication List    ASK your doctor about these medications       aspirin EC 81 MG tablet  Take 81 mg by mouth daily.     glimepiride 2 MG tablet  Commonly known as:  AMARYL  Take 1 mg by mouth daily as needed (for blood sugar).     levothyroxine 50 MCG tablet  Commonly known as:  SYNTHROID, LEVOTHROID  Take 50 mcg by mouth daily before breakfast.     metFORMIN 500 MG tablet  Commonly known as:  GLUCOPHAGE  Take 1,000 mg by mouth 2 (two) times daily with a meal.     metoprolol succinate 50 MG 24 hr tablet  Commonly known as:  TOPROL-XL  Take 50 mg by mouth 2 (two) times daily. Take with or immediately following a meal.     ranitidine 150 MG tablet  Commonly known as:  ZANTAC  Take 150 mg by mouth daily as needed for heartburn.     vitamin B-12 1000 MCG tablet  Commonly known as:  CYANOCOBALAMIN  Take 1,000 mcg by mouth daily.         At home rest most of the time. Get up 9 or 10 times each day and take a 15 or 20 minute walk. No riding in the car and to your first postoperative appointment. If you have neck surgery you may shower from the chest down starting on the third postoperative day. If you had back surgery he may start  showering on the third postoperative day with saran wrap wrapped around your incisional area 3 times. After the shower remove the saran wrap. Take pain medicine as needed and other medications as instructed. Call my office for an appointment.  SignedReinaldo Meeker: Prentiss Hammett O, MD 08/23/2013, 8:19 AM

## 2013-08-23 NOTE — Discharge Instructions (Signed)
Wound Care Keep incision covered and dry for one week. You may shower from the neck down. You may remove outer bandage after one week.  Do not put any creams, lotions, or ointments on incision. Leave steri-strips on neck.  They will fall off by themselves. Activity Walk each and every day, increasing distance each day. No lifting greater than 5 lbs.  Avoid bending, arching, and twisting. No driving or riding in car until further notice at follow up appointment. If provided with back brace, wear when out of bed.  It is not necessary to wear brace in bed. Diet Resume your normal diet.  Return to Work Will be discussed at you follow up appointment. Call Your Doctor If Any of These Occur Redness, drainage, or swelling at the wound.  Temperature greater than 101 degrees. Severe pain not relieved by pain medication. Incision starts to come apart. Follow Up Appt Call today for appointment in 1-2 weeks ([phone]) or for problems.  If you have any hardware placed in your spine, you will need an x-ray before your appointment.    Laminectomy During a laminectomy, small pieces of bone in the spine called lamina are removed. The ligaments underneath the lamina and parts of the joints that have grown too large are also removed. This takes pressure off the nerves.  LET [facility] CARE PROVIDER KNOW ABOUT:  Any allergies you have.  All medicines you are taking, including vitamins, herbs, eye drops, creams, and over-the-counter medicines.  Previous problems you or members of your family have had with the use of anesthetics.  Any blood disorders you have.  Previous surgeries you have had.  Medical conditions you have. RISKS AND COMPLICATIONS  Generally, laminectomy is a safe procedure. However, as with any procedure, complications can occur. Possible complications include:  Infection near the incision.  Nerve damage. Signs of this can be pain, weakness, or numbness.  Leaking of spinal  fluid.  Blood clot in a leg. The clot can move to the lungs. This can be very serious.  Bowel or bladder incontinence (rare). BEFORE THE PROCEDURE   You will need to stop taking certain medicines as directed by your health care provider.  If you smoke, stop at least 2 weeks before the procedure. Smoking can slow down the healing process and increase the risk of complications.  Do not eat or drink anything for at least 8 hours before the procedure. Take any medicines that your health care provider tells you to keep taking with a sip of water.  Do not drink alcohol the day before your surgery.  Tell your health care provider if you develop a cold or any infection before your surgery.  Arrange for someone to drive you home after the procedure or after your hospital stay. Also arrange for someone to help you with activities during recovery. PROCEDURE  Small monitors will be placed on your body. They are used to check your heart, blood pressure, and oxygen level.  An IV tube will be inserted into one of your veins. Medicine will flow directly into your body through the IV tube.  You might be given a sedative. This will help you relax.  You will be given a medicine to make you sleep (general anesthetic), and a breathing tube will be placed into your lungs. During general anesthesia, you are unaware of the procedure and do not feel any pain.  Your back will be cleaned with a special solution to kill germs on your skin.  Once  you are asleep, the surgeon will make a 2-inch to 5-inch cut (incision) in your back. The length of the incision will depend on how many spinal bones (vertebrae) are being operated on.  Muscles in the back will be moved away from the vertebrae and pulled to the side.  Pieces of lamina will be removed.  The ligament that lies under the lamina and connects your vertebrae will be removed.  Enough ligaments and thickened joints will be removed to take pressure off your  nerves.  Your nerves will be identified, and their passage will be tracked and assessed for excessive tightness.  Your back muscles will be moved back into their normal position.  The area under your skin will be closed with small, absorbable stitches. These stitches do not need to be removed.  Your skin will be closed with small absorbable stitches or staples.  A dressing will be put over your incision.  The procedure may take 1-3 hours. AFTER THE PROCEDURE   You will stay in a recovery area until the anesthesia has worn off. Your blood pressure and pulse will be checked every so often. Then you will be taken to a hospital room.  You may continue to get fluids through the IV tube for a while.  Some pain is normal. You may be given pain medicine while still in the recovery area.  It is important to be up and moving as soon as possible after a surgery. Physical therapists will help you start walking.  To prevent blood clots in your legs:  You may be given special stockings to wear.  You may need to take medicine to prevent clots.  You may be asked to do special breathing exercises to re-expand your lungs. This is to prevent a lung infection.  Most people stay in the hospital for 1-3 days after a laminectomy. Document Released: 01/12/2009 Document Revised: 11/14/2012 Document Reviewed: 09/05/2012 [facility] Patient Information 2015 [employer] [place]. This information is not intended to replace advice given to you by your health care provider. Make sure you discuss any questions you have with your health care provider.

## 2015-04-19 IMAGING — CR DG LUMBAR SPINE 2-3V
2 series · 2 of 2 positions shown · non-contrast
Comparison: MRI lumbar spine dated 06/12/2011

CLINICAL DATA: Lumbar microdiscectomy

EXAM:
LUMBAR SPINE - 2-3 VIEW

[lat (1 of 2)]
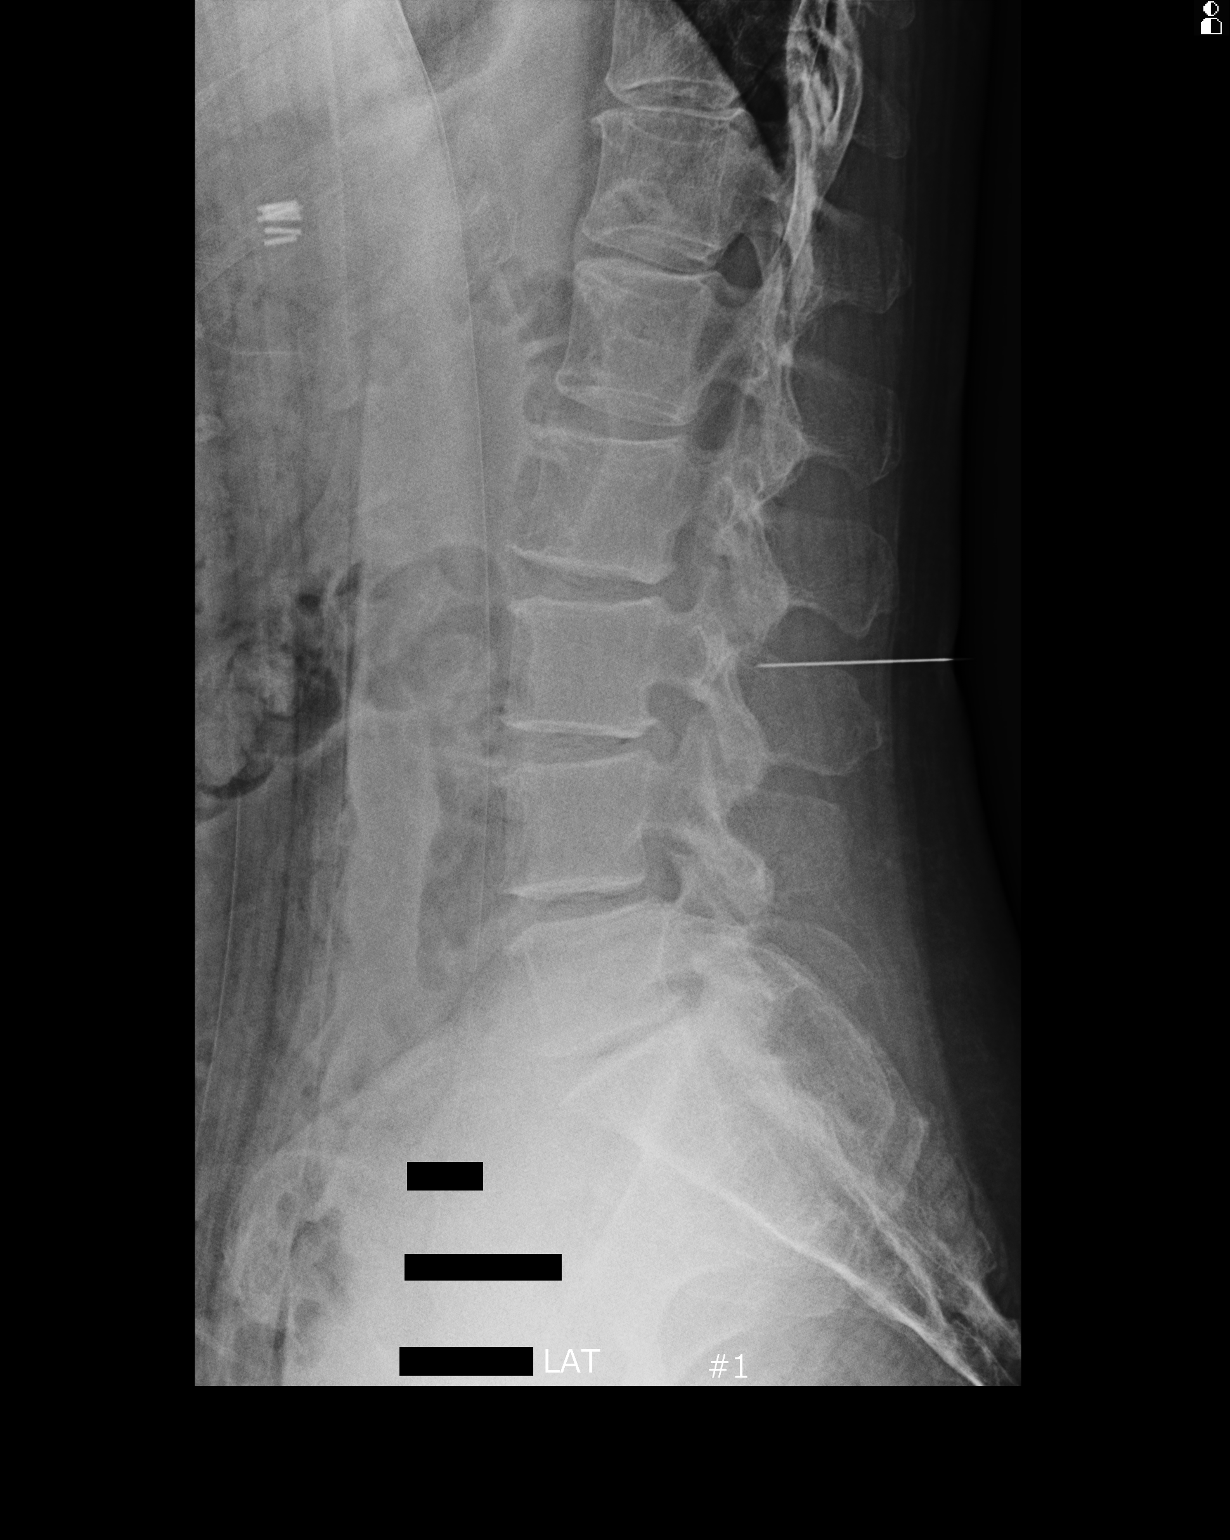

[lat (2 of 2)]
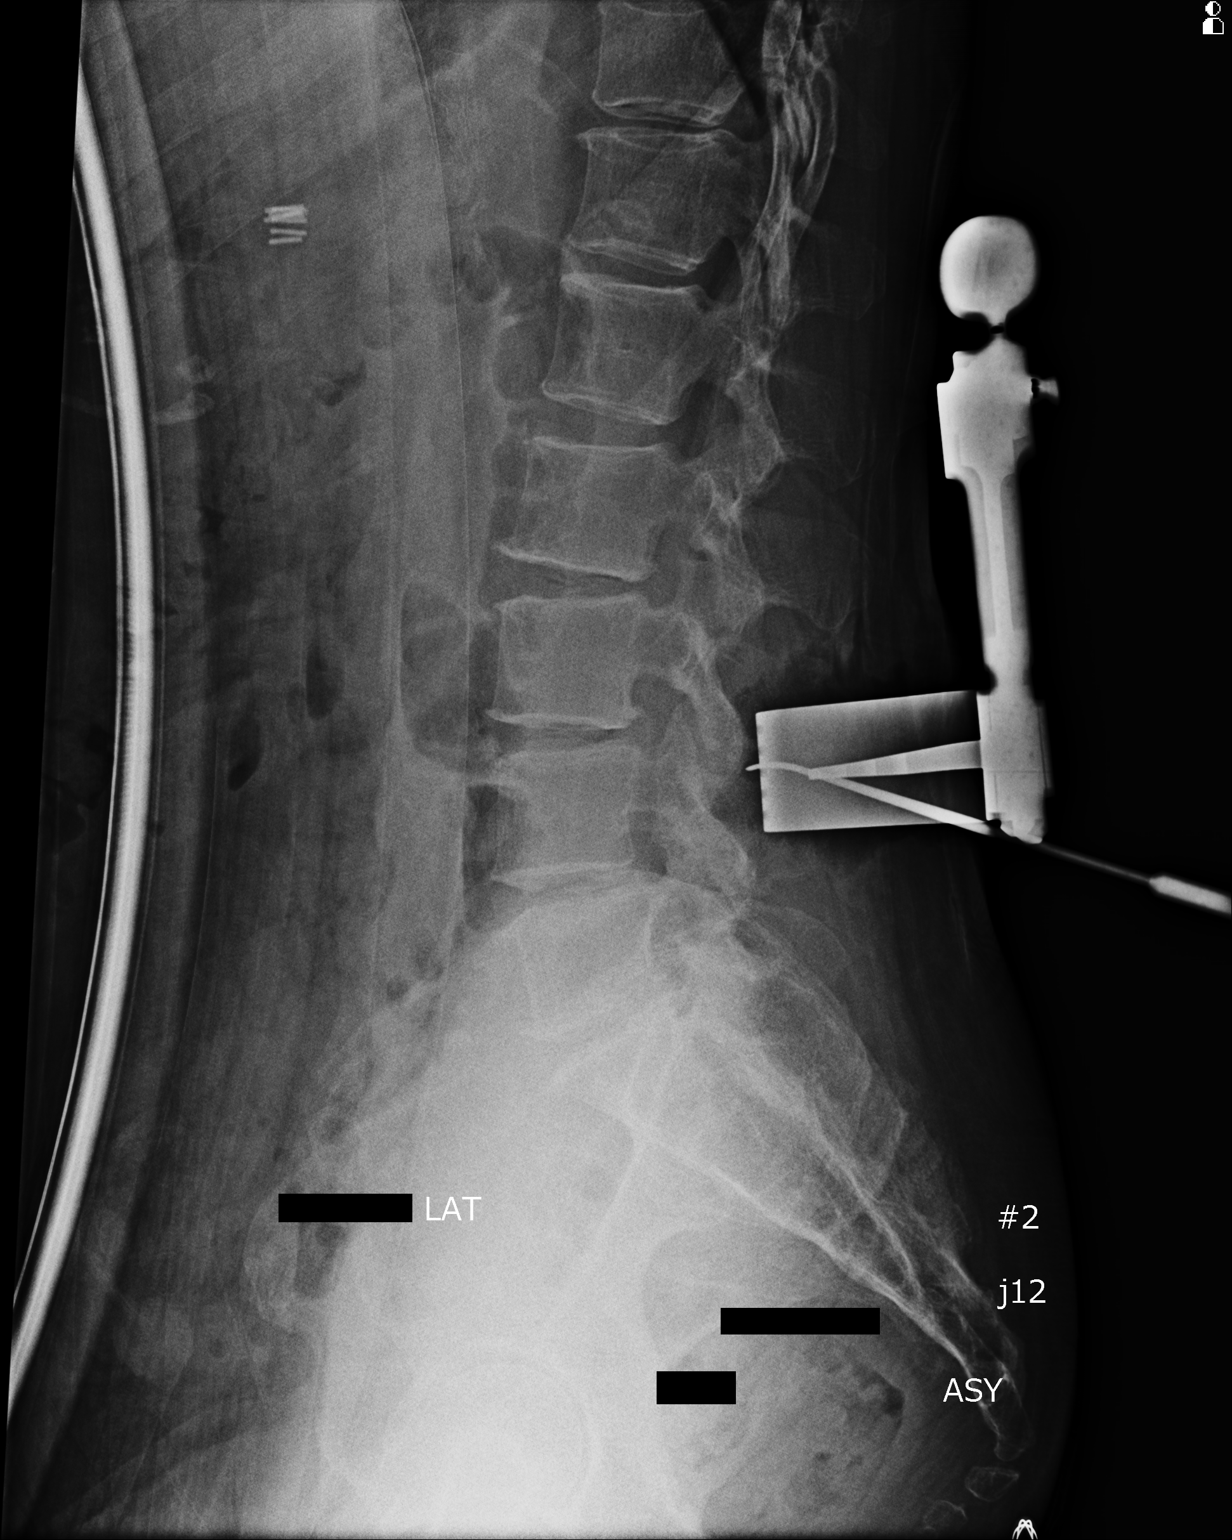

[2 of 2 positions shown; findings below may reference images not displayed]

FINDINGS: Initial lateral radiograph demonstrates a surgical probe posterior
to the L3 vertebral body, just above the posterior spinous process.

Second lateral radiograph demonstrates surgical hardware at L3-4.
IMPRESSION: Intraoperative localization as above.
